# Patient Record
Sex: Male | Born: 1965 | Race: White | Hispanic: No | Marital: Single | Smoking: Current every day smoker
Health system: Southern US, Community
[De-identification: ages and names within clinical notes are randomized; demographics above are authoritative.]

## PROBLEM LIST (undated history)

## (undated) DIAGNOSIS — Z87442 Personal history of urinary calculi: Secondary | ICD-10-CM

## (undated) DIAGNOSIS — E7800 Pure hypercholesterolemia, unspecified: Secondary | ICD-10-CM

## (undated) DIAGNOSIS — R519 Headache, unspecified: Secondary | ICD-10-CM

## (undated) DIAGNOSIS — R7303 Prediabetes: Secondary | ICD-10-CM

## (undated) HISTORY — PX: OTHER SURGICAL HISTORY: SHX169

---

## 2009-02-06 ENCOUNTER — Emergency Department (HOSPITAL_COMMUNITY): Admission: EM | Admit: 2009-02-06 | Discharge: 2009-02-06 | Payer: Self-pay | Admitting: Emergency Medicine

## 2014-08-07 ENCOUNTER — Emergency Department (HOSPITAL_BASED_OUTPATIENT_CLINIC_OR_DEPARTMENT_OTHER)
Admission: EM | Admit: 2014-08-07 | Discharge: 2014-08-07 | Disposition: A | Discharge status: Home / Self Care | Payer: BLUE CROSS/BLUE SHIELD | Attending: Emergency Medicine | Admitting: Emergency Medicine

## 2014-08-07 ENCOUNTER — Emergency Department (HOSPITAL_BASED_OUTPATIENT_CLINIC_OR_DEPARTMENT_OTHER): Payer: BLUE CROSS/BLUE SHIELD

## 2014-08-07 ENCOUNTER — Encounter (HOSPITAL_BASED_OUTPATIENT_CLINIC_OR_DEPARTMENT_OTHER): Payer: Self-pay | Admitting: *Deleted

## 2014-08-07 DIAGNOSIS — Z720 Tobacco use: Secondary | ICD-10-CM | POA: Diagnosis not present

## 2014-08-07 DIAGNOSIS — S161XXA Strain of muscle, fascia and tendon at neck level, initial encounter: Secondary | ICD-10-CM | POA: Diagnosis not present

## 2014-08-07 DIAGNOSIS — Y998 Other external cause status: Secondary | ICD-10-CM | POA: Insufficient documentation

## 2014-08-07 DIAGNOSIS — Y92410 Unspecified street and highway as the place of occurrence of the external cause: Secondary | ICD-10-CM | POA: Diagnosis not present

## 2014-08-07 DIAGNOSIS — Y9389 Activity, other specified: Secondary | ICD-10-CM | POA: Insufficient documentation

## 2014-08-07 DIAGNOSIS — S199XXA Unspecified injury of neck, initial encounter: Secondary | ICD-10-CM | POA: Diagnosis present

## 2014-08-07 MED ORDER — CYCLOBENZAPRINE HCL 10 MG PO TABS: 10.0000 mg | ORAL_TABLET | Freq: Three times a day (TID) | ORAL | Status: AC | PRN

## 2014-08-07 MED ORDER — IBUPROFEN 800 MG PO TABS: 800.0000 mg | ORAL_TABLET | Freq: Three times a day (TID) | ORAL | Status: AC

## 2014-08-07 NOTE — ED Notes (Signed)
Unable to locate pt  

## 2014-08-07 NOTE — Discharge Instructions (Signed)
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
## 2015-08-23 DIAGNOSIS — M545 Low back pain: Secondary | ICD-10-CM | POA: Diagnosis not present

## 2015-11-25 ENCOUNTER — Encounter: Payer: Self-pay | Admitting: Sports Medicine

## 2015-11-25 ENCOUNTER — Ambulatory Visit (INDEPENDENT_AMBULATORY_CARE_PROVIDER_SITE_OTHER): Payer: BLUE CROSS/BLUE SHIELD

## 2015-11-25 ENCOUNTER — Ambulatory Visit (INDEPENDENT_AMBULATORY_CARE_PROVIDER_SITE_OTHER): Payer: BLUE CROSS/BLUE SHIELD | Admitting: Sports Medicine

## 2015-11-25 DIAGNOSIS — M79674 Pain in right toe(s): Secondary | ICD-10-CM | POA: Diagnosis not present

## 2015-11-25 DIAGNOSIS — S91209A Unspecified open wound of unspecified toe(s) with damage to nail, initial encounter: Secondary | ICD-10-CM

## 2015-11-25 DIAGNOSIS — L03031 Cellulitis of right toe: Secondary | ICD-10-CM

## 2015-11-25 DIAGNOSIS — L02611 Cutaneous abscess of right foot: Secondary | ICD-10-CM

## 2015-11-25 MED ORDER — HYDROCODONE-ACETAMINOPHEN 5-325 MG PO TABS: 1.0000 | ORAL_TABLET | Freq: Four times a day (QID) | ORAL | 0 refills | Status: DC | PRN

## 2015-11-25 MED ORDER — CEPHALEXIN 500 MG PO CAPS: 500.0000 mg | ORAL_CAPSULE | Freq: Three times a day (TID) | ORAL | 0 refills | Status: DC

## 2015-11-25 NOTE — Patient Instructions (Signed)
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
## 2015-12-10 ENCOUNTER — Ambulatory Visit (INDEPENDENT_AMBULATORY_CARE_PROVIDER_SITE_OTHER): Payer: BLUE CROSS/BLUE SHIELD | Admitting: Sports Medicine

## 2015-12-10 ENCOUNTER — Encounter: Payer: Self-pay | Admitting: Sports Medicine

## 2015-12-10 DIAGNOSIS — Z9889 Other specified postprocedural states: Secondary | ICD-10-CM

## 2015-12-10 DIAGNOSIS — M79674 Pain in right toe(s): Secondary | ICD-10-CM

## 2015-12-10 NOTE — Progress Notes (Signed)
Subjective: Kirk FowlerWilliam R Small is a 50 y.o. male patient returns to office today for follow up evaluation after having Right total temporary nail avulsion performed on 11-25-15. Patient has been soaking using betadine and applying topical antibiotic covered with bandaid daily. Patient denies fever/chills/nausea/vomitting/any other related constitutional symptoms at this time. Keflex completed. Reports still sore at times but no other issues.   There are no active problems to display for this patient.   Current Outpatient Prescriptions on File Prior to Visit  Medication Sig Dispense Refill  . cyclobenzaprine (FLEXERIL) 10 MG tablet Take 1 tablet (10 mg total) by mouth 3 (three) times daily as needed for muscle spasms. 20 tablet 0  . ibuprofen (ADVIL,MOTRIN) 800 MG tablet Take 1 tablet (800 mg total) by mouth 3 (three) times daily. 21 tablet 0   No current facility-administered medications on file prior to visit.     No Known Allergies  Objective:  General: Well developed, nourished, in no acute distress, alert and oriented x3   Dermatology: Skin is warm, dry and supple bilateral. Right hallux nail bed appears to be clean, dry, with mild granular tissue and surrounding eschar/scab. (-) Erythema. (-) Edema. (-) serosanguous drainage, mild maceration present. The remaining nails appear unremarkable at this time. There are no other lesions or other signs of infection present.  Neurovascular status: Intact. No lower extremity swelling; No pain with calf compression bilateral.  Musculoskeletal: Decreased tenderness to palpation of the Right hallux nail bed. Muscular strength within normal limits bilateral.   Assesement and Plan: Problem List Items Addressed This Visit    None    Visit Diagnoses    S/P nail surgery    -  Primary   Toe pain, right          -Examined patient  -Cleansed right hallux nail bed nail fold and gently scrubbed with peroxide and q-tip/curetted away eschar at site  and applied antibiotic cream covered with bandaid.  -Discussed plan of care with patient. -Patient to now begin soaking in a weak solution of Epsom salt and warm water. Patient was instructed to soak for 15-20 minutes each day until the toe appears normal and there is no drainage, redness, tenderness, or swelling at the procedure site, and apply neosporin and a gauze or bandaid dressing each day as needed. May leave open to air at night. -Educated patient on long term care after nail surgery secondary to injury. -Patient was instructed to monitor the toe for reoccurrence and signs of infection; Patient advised to return to office or go to ER if toe becomes red, hot or swollen. -Patient is to return as needed or sooner if problems arise.  Asencion Islamitorya Shanquita Ronning, DPM

## 2015-12-10 NOTE — Patient Instructions (Signed)
Long Term Care Instructions-Post Nail Surgery  You have had your nail removed.  This can drain for 6-8 weeks or longer.  It is important to keep this area clean, covered, and follow the soaking instructions dispensed at the time of your surgery.  This area will eventually dry and form a scab.  Once the scab forms you no longer need to soak or apply a dressing.  If at any time you experience an increase in pain, redness, swelling, or drainage, you should contact the office as soon as possible.
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
## 2016-11-25 DIAGNOSIS — J449 Chronic obstructive pulmonary disease, unspecified: Secondary | ICD-10-CM | POA: Diagnosis not present

## 2016-11-25 DIAGNOSIS — R0789 Other chest pain: Secondary | ICD-10-CM | POA: Diagnosis not present

## 2016-11-25 DIAGNOSIS — L570 Actinic keratosis: Secondary | ICD-10-CM | POA: Diagnosis not present

## 2016-11-27 DIAGNOSIS — Z8249 Family history of ischemic heart disease and other diseases of the circulatory system: Secondary | ICD-10-CM | POA: Diagnosis not present

## 2016-12-23 DIAGNOSIS — L820 Inflamed seborrheic keratosis: Secondary | ICD-10-CM | POA: Diagnosis not present

## 2016-12-23 DIAGNOSIS — C44612 Basal cell carcinoma of skin of right upper limb, including shoulder: Secondary | ICD-10-CM | POA: Diagnosis not present

## 2016-12-23 DIAGNOSIS — L570 Actinic keratosis: Secondary | ICD-10-CM | POA: Diagnosis not present

## 2016-12-23 DIAGNOSIS — X32XXXA Exposure to sunlight, initial encounter: Secondary | ICD-10-CM | POA: Diagnosis not present

## 2017-01-22 DIAGNOSIS — X32XXXD Exposure to sunlight, subsequent encounter: Secondary | ICD-10-CM | POA: Diagnosis not present

## 2017-01-22 DIAGNOSIS — L570 Actinic keratosis: Secondary | ICD-10-CM | POA: Diagnosis not present

## 2017-01-22 DIAGNOSIS — Z85828 Personal history of other malignant neoplasm of skin: Secondary | ICD-10-CM | POA: Diagnosis not present

## 2017-01-22 DIAGNOSIS — Z08 Encounter for follow-up examination after completed treatment for malignant neoplasm: Secondary | ICD-10-CM | POA: Diagnosis not present

## 2017-08-05 DIAGNOSIS — L570 Actinic keratosis: Secondary | ICD-10-CM | POA: Diagnosis not present

## 2017-08-05 DIAGNOSIS — Z08 Encounter for follow-up examination after completed treatment for malignant neoplasm: Secondary | ICD-10-CM | POA: Diagnosis not present

## 2017-08-05 DIAGNOSIS — X32XXXD Exposure to sunlight, subsequent encounter: Secondary | ICD-10-CM | POA: Diagnosis not present

## 2017-08-05 DIAGNOSIS — Z85828 Personal history of other malignant neoplasm of skin: Secondary | ICD-10-CM | POA: Diagnosis not present

## 2018-02-03 DIAGNOSIS — X32XXXD Exposure to sunlight, subsequent encounter: Secondary | ICD-10-CM | POA: Diagnosis not present

## 2018-02-03 DIAGNOSIS — Z85828 Personal history of other malignant neoplasm of skin: Secondary | ICD-10-CM | POA: Diagnosis not present

## 2018-02-03 DIAGNOSIS — L570 Actinic keratosis: Secondary | ICD-10-CM | POA: Diagnosis not present

## 2018-02-03 DIAGNOSIS — Z08 Encounter for follow-up examination after completed treatment for malignant neoplasm: Secondary | ICD-10-CM | POA: Diagnosis not present

## 2018-03-28 DIAGNOSIS — J209 Acute bronchitis, unspecified: Secondary | ICD-10-CM | POA: Diagnosis not present

## 2018-03-28 DIAGNOSIS — J0100 Acute maxillary sinusitis, unspecified: Secondary | ICD-10-CM | POA: Diagnosis not present

## 2018-05-13 DIAGNOSIS — Z043 Encounter for examination and observation following other accident: Secondary | ICD-10-CM | POA: Diagnosis not present

## 2018-05-13 DIAGNOSIS — S60413A Abrasion of left middle finger, initial encounter: Secondary | ICD-10-CM | POA: Diagnosis not present

## 2018-05-13 DIAGNOSIS — S0990XA Unspecified injury of head, initial encounter: Secondary | ICD-10-CM | POA: Diagnosis not present

## 2018-05-13 DIAGNOSIS — R0789 Other chest pain: Secondary | ICD-10-CM | POA: Diagnosis not present

## 2018-05-13 DIAGNOSIS — S20219A Contusion of unspecified front wall of thorax, initial encounter: Secondary | ICD-10-CM | POA: Diagnosis not present

## 2018-05-13 DIAGNOSIS — Y92413 State road as the place of occurrence of the external cause: Secondary | ICD-10-CM | POA: Diagnosis not present

## 2018-05-13 DIAGNOSIS — M542 Cervicalgia: Secondary | ICD-10-CM | POA: Diagnosis not present

## 2018-05-13 DIAGNOSIS — F17210 Nicotine dependence, cigarettes, uncomplicated: Secondary | ICD-10-CM | POA: Diagnosis not present

## 2018-05-13 DIAGNOSIS — Z23 Encounter for immunization: Secondary | ICD-10-CM | POA: Diagnosis not present

## 2018-05-13 DIAGNOSIS — S161XXA Strain of muscle, fascia and tendon at neck level, initial encounter: Secondary | ICD-10-CM | POA: Diagnosis not present

## 2018-05-13 DIAGNOSIS — S50812A Abrasion of left forearm, initial encounter: Secondary | ICD-10-CM | POA: Diagnosis not present

## 2018-05-13 DIAGNOSIS — S20212A Contusion of left front wall of thorax, initial encounter: Secondary | ICD-10-CM | POA: Diagnosis not present

## 2018-05-13 DIAGNOSIS — S0081XA Abrasion of other part of head, initial encounter: Secondary | ICD-10-CM | POA: Diagnosis not present

## 2018-06-09 DIAGNOSIS — J101 Influenza due to other identified influenza virus with other respiratory manifestations: Secondary | ICD-10-CM | POA: Diagnosis not present

## 2019-11-22 DIAGNOSIS — L255 Unspecified contact dermatitis due to plants, except food: Secondary | ICD-10-CM | POA: Diagnosis not present

## 2020-01-17 DIAGNOSIS — R05 Cough: Secondary | ICD-10-CM | POA: Diagnosis not present

## 2020-01-17 DIAGNOSIS — Z20822 Contact with and (suspected) exposure to covid-19: Secondary | ICD-10-CM | POA: Diagnosis not present

## 2020-01-19 ENCOUNTER — Telehealth: Payer: Self-pay | Admitting: Oncology

## 2020-01-19 NOTE — Telephone Encounter (Signed)
Called to Discuss with patient about Covid symptoms and the use of regeneron, a monoclonal antibody infusion for those with mild to moderate Covid symptoms and at a high risk of hospitalization.     Pt is qualified for this infusion at the [facility] infusion center due to co-morbid conditions and/or a member of an at-risk group.    No past medical history on file. High risk- Smoker and Obesity    Unable to reach pt. Left VM for return phone call.   Mignon Pine  AGNP-C [phone] (Infusion Center Hotline)

## 2022-10-26 ENCOUNTER — Encounter (HOSPITAL_BASED_OUTPATIENT_CLINIC_OR_DEPARTMENT_OTHER): Payer: Self-pay

## 2022-10-26 ENCOUNTER — Encounter (HOSPITAL_BASED_OUTPATIENT_CLINIC_OR_DEPARTMENT_OTHER): Payer: Self-pay | Admitting: Family Medicine

## 2022-10-26 ENCOUNTER — Ambulatory Visit (HOSPITAL_BASED_OUTPATIENT_CLINIC_OR_DEPARTMENT_OTHER)
Admission: RE | Admit: 2022-10-26 | Discharge: 2022-10-26 | Disposition: A | Discharge status: Home / Self Care | Payer: Commercial Managed Care - HMO | Source: Ambulatory Visit | Attending: Family Medicine | Admitting: Family Medicine

## 2022-10-26 ENCOUNTER — Other Ambulatory Visit (HOSPITAL_BASED_OUTPATIENT_CLINIC_OR_DEPARTMENT_OTHER): Payer: Self-pay | Admitting: Family Medicine

## 2022-10-26 ENCOUNTER — Ambulatory Visit (INDEPENDENT_AMBULATORY_CARE_PROVIDER_SITE_OTHER): Payer: Commercial Managed Care - HMO | Admitting: Family Medicine

## 2022-10-26 VITALS — BP 127/93 | HR 95 | Ht 68.0 in | Wt 185.0 lb

## 2022-10-26 DIAGNOSIS — R1032 Left lower quadrant pain: Secondary | ICD-10-CM | POA: Diagnosis present

## 2022-10-26 DIAGNOSIS — R310 Gross hematuria: Secondary | ICD-10-CM | POA: Insufficient documentation

## 2022-10-26 DIAGNOSIS — N200 Calculus of kidney: Secondary | ICD-10-CM

## 2022-10-26 DIAGNOSIS — J449 Chronic obstructive pulmonary disease, unspecified: Secondary | ICD-10-CM | POA: Insufficient documentation

## 2022-10-26 DIAGNOSIS — Z7689 Persons encountering health services in other specified circumstances: Secondary | ICD-10-CM

## 2022-10-26 DIAGNOSIS — Z8249 Family history of ischemic heart disease and other diseases of the circulatory system: Secondary | ICD-10-CM | POA: Insufficient documentation

## 2022-10-26 LAB — POCT URINALYSIS DIP (CLINITEK)
Bilirubin, UA: NEGATIVE
Glucose, UA: NEGATIVE mg/dL
Ketones, POC UA: NEGATIVE mg/dL
Nitrite, UA: NEGATIVE
POC PROTEIN,UA: 100 — AB
Spec Grav, UA: >=1.03 — AB (ref 1.010–1.025)
Urobilinogen, UA: 0.2 E.U./dL
pH, UA: 6 (ref 5.0–8.0)

## 2022-10-26 MED ORDER — TAMSULOSIN HCL 0.4 MG PO CAPS: 0.4000 mg | ORAL_CAPSULE | Freq: Every day | ORAL | 3 refills | Status: AC

## 2022-10-26 NOTE — Progress Notes (Signed)
Patient made aware of CT results including large 13mm stone in left renal pelvis concerning for intermittent obstruction and additional nonobstructing stones. Tamsulosin sent in for patient to start. CBC and CMP changed to STAT from labwork drawn today to evaluate for infection and renal function. Pt denies retention, fever/chills at this time. Urgent referral placed to [facility]. Pt understanding to go to ER if fever/chills, urine retention, pain, or nausea/vomiting occurs.

## 2022-10-26 NOTE — Patient Instructions (Signed)
Go downstairs to 1st Floor Imaging for your CT Scan.   I will contact you with the results by the end of day.

## 2022-10-26 NOTE — Progress Notes (Signed)
New Patient Office Visit  Subjective:   Kirk Small [birth year] 10/26/2022  Chief Complaint  Patient presents with   New Patient (Initial Visit)   Abdominal Pain    Pt is here to establish with new PCP. Also has been having abdominal pain on left side which has been bothering him for about a month now.    HPI: Kirk Small presents today to establish care at [facility] at [facility]. Introduced to Publishing rights manager role and practice setting.  All questions answered.   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 Pain has been worsening in the past month.  Location:  LLQ Pain, left groin Description of pain: Sharp, intermittent. Pain increases with movement.  Radiation: Towards Umbilicus and towards pelvis, inguingal area Severity: 8/10 Alleviating factors: Goody Powder, Ibuprofen Aggravating factors: Constipation  Treatments tried: OTC Medications  Fever: no Nausea: yes Vomiting: yes Weight loss: no Decreased appetite: no Diarrhea: no Constipation: yes Blood in stool: no Heartburn: no Dysuria/urinary frequency: no Hematuria: no History of sexually transmitted disease: no Recurrent NSAID use: yes  He states sometimes the pain eases with eating. Pain worsens with movement in LLQ. Patient does not drink a lot of water, usually sweet tea and Kool Aid daily. Pt states his urine "has been darker" than usual.   The following portions of the patient's history were reviewed and updated as appropriate: past medical history, past surgical history, family history, social history, allergies, medications, and problem list.   Patient Active Problem List   Diagnosis Date Noted   Chronic obstructive pulmonary disease (HCC) 10/26/2022   Family history of MI (myocardial infarction) 10/26/2022   Encounter to establish care  10/26/2022   Left lower quadrant abdominal pain 10/26/2022   Gross hematuria 10/26/2022   History reviewed. No pertinent past medical history. History reviewed. No pertinent surgical history. Family History  Problem Relation Age of Onset   Cancer Paternal Uncle    Social History   Socioeconomic History   Marital status: Single    Spouse name: Not on file   Number of children: Not on file   Years of education: Not on file   Highest education level: Not on file  Occupational History   Not on file  Tobacco Use   Smoking status: Former    Packs/day: 1.00    Years: 38.00    Additional pack years: 0.00    Total pack years: 38.00    Types: Cigarettes    Quit date: 79    Years since quitting: 39.5   Smokeless tobacco: Not on file  Substance and Sexual Activity   Alcohol use: No   Drug use: No   Sexual activity: Not on file  Other Topics Concern   Not on file  Social History Narrative   Not on file   Social Determinants of Health   Financial Resource Strain: Not on file  Food Insecurity: Not on file  Transportation Needs: Not on file  Physical Activity: Not on file  Stress: Not on file  Social Connections: Not on file  Intimate Partner Violence: Not on file   Outpatient Medications Prior to Visit  Medication Sig Dispense Refill   cyclobenzaprine (FLEXERIL) 10 MG tablet Take 1 tablet (10 mg total) by mouth 3 (three) times daily as needed for muscle spasms. 20 tablet 0   ibuprofen (  ADVIL,MOTRIN) 800 MG tablet Take 1 tablet (800 mg total) by mouth 3 (three) times daily. 21 tablet 0   No facility-administered medications prior to visit.   No Known Allergies  ROS: A complete ROS was performed with pertinent positives/negatives noted in the HPI. The remainder of the ROS are negative.   Objective:   Today's Vitals   10/26/22 1311  BP: (!) 127/93  Pulse: 95  SpO2: 98%  Weight: 185 lb (83.9 kg)  Height: 5\' 8"  (1.727 m)    GENERAL: Well-appearing, in NAD. Well  nourished.  SKIN: Pink, warm and dry. No rash, lesion, ulceration, or ecchymoses.  NECK: Trachea midline. Full ROM w/o pain or tenderness. No lymphadenopathy.  RESPIRATORY: Chest wall symmetrical. Respirations even and non-labored. Breath sounds clear to auscultation bilaterally.  CARDIAC: S1, S2 present, regular rate and rhythm. Peripheral pulses 2+ bilaterally.  GI: Abdomen soft, non-distended.. Normoactive bowel sounds. Moderate tenderness to LLQ and left inguinal area. No palpable masses.  No hepatomegaly or splenomegaly. No CVA tenderness.  MSK: Muscle tone and strength appropriate for age. Joints w/o tenderness, redness, or swelling.  EXTREMITIES: Without clubbing, cyanosis, or edema.  NEUROLOGIC: No motor or sensory deficits. Steady, even gait.  PSYCH/MENTAL STATUS: Alert, oriented x 3. Cooperative, appropriate mood and affect.   Health Maintenance Due  Topic Date Due   HIV Screening  Never done   Hepatitis C Screening  Never done   Colonoscopy  Never done   Zoster Vaccines- Shingrix (1 of 2) Never done    Results for orders placed or performed in visit on 10/26/22  POCT URINALYSIS DIP (CLINITEK)  Result Value Ref Range   Color, UA brown (A) yellow   Clarity, UA cloudy (A) clear   Glucose, UA negative negative mg/dL   Bilirubin, UA negative negative   Ketones, POC UA negative negative mg/dL   Spec Grav, UA >=9.629 (A) 1.010 - 1.025   Blood, UA large (A) negative   pH, UA 6.0 5.0 - 8.0   POC PROTEIN,UA =100 (A) negative, trace   Urobilinogen, UA 0.2 0.2 or 1.0 E.U./dL   Nitrite, UA Negative Negative   Leukocytes, UA Trace (A) Negative       Assessment & Plan:  1. Encounter to establish care Discussed need for AE and labs. Pt will return in 4 weeks for physical exam. Labs drawn.   - Lipid panel - TSH - Hemoglobin A1c  2. Left lower quadrant abdominal pain 3. Gross hematuria Patient had gross hematuria in clinic with urine sample. Will obtain STAT CT Stone study to  rule out renal stone versus bladder cancer versus renal or bladder mass causing symptoms. Will obtain labs today for PSA, CMP, CBC, and urine culture as well. Pt sent for CT to Imaging at [facility] from the clinic.   - PSA - Comprehensive metabolic panel - CBC with Differential/Platelet - Urine Culture - CT RENAL STONE STUDY; Future - POCT URINALYSIS DIP (CLINITEK)   Patient to reach out to office if new, worrisome, or unresolved symptoms arise or if no improvement in patient's condition. Patient verbalized understanding and is agreeable to treatment plan. All questions answered to patient's satisfaction.    Return in about 4 weeks (around 11/23/2022) for ANNUAL PHYSICAL.   Yolanda Manges, FNP

## 2022-10-27 ENCOUNTER — Encounter (HOSPITAL_BASED_OUTPATIENT_CLINIC_OR_DEPARTMENT_OTHER): Payer: Self-pay | Admitting: Family Medicine

## 2022-10-27 ENCOUNTER — Telehealth (HOSPITAL_BASED_OUTPATIENT_CLINIC_OR_DEPARTMENT_OTHER): Payer: Self-pay | Admitting: Family Medicine

## 2022-10-27 ENCOUNTER — Other Ambulatory Visit (HOSPITAL_BASED_OUTPATIENT_CLINIC_OR_DEPARTMENT_OTHER): Payer: Self-pay | Admitting: Family Medicine

## 2022-10-27 DIAGNOSIS — N200 Calculus of kidney: Secondary | ICD-10-CM | POA: Insufficient documentation

## 2022-10-27 LAB — COMPREHENSIVE METABOLIC PANEL
ALT: 19 IU/L (ref 0–44)
AST: 20 IU/L (ref 0–40)
Albumin: 4.2 g/dL (ref 3.8–4.9)
Alkaline Phosphatase: 75 IU/L (ref 44–121)
BUN/Creatinine Ratio: 18 (ref 9–20)
BUN: 20 mg/dL (ref 6–24)
Bilirubin Total: <0.2 mg/dL (ref 0.0–1.2)
CO2: 24 mmol/L (ref 20–29)
Calcium: 9.2 mg/dL (ref 8.7–10.2)
Chloride: 103 mmol/L (ref 96–106)
Creatinine, Ser: 1.09 mg/dL (ref 0.76–1.27)
Globulin, Total: 2.4 g/dL (ref 1.5–4.5)
Glucose: 92 mg/dL (ref 70–99)
Potassium: 4.8 mmol/L (ref 3.5–5.2)
Sodium: 138 mmol/L (ref 134–144)
Total Protein: 6.6 g/dL (ref 6.0–8.5)
eGFR: 81 mL/min/{1.73_m2} (ref >59)

## 2022-10-27 LAB — CBC WITH DIFFERENTIAL/PLATELET
Basophils Absolute: 0.1 10*3/uL (ref 0.0–0.2)
Basos: 1 %
EOS (ABSOLUTE): 0.3 10*3/uL (ref 0.0–0.4)
Eos: 4 %
Hematocrit: 46.1 % (ref 37.5–51.0)
Hemoglobin: 15 g/dL (ref 13.0–17.7)
Immature Grans (Abs): 0 10*3/uL (ref 0.0–0.1)
Immature Granulocytes: 0 %
Lymphocytes Absolute: 3.1 10*3/uL (ref 0.7–3.1)
Lymphs: 33 %
MCH: 28.4 pg (ref 26.6–33.0)
MCHC: 32.5 g/dL (ref 31.5–35.7)
MCV: 87 fL (ref 79–97)
Monocytes Absolute: 0.6 10*3/uL (ref 0.1–0.9)
Monocytes: 6 %
Neutrophils Absolute: 5.2 10*3/uL (ref 1.4–7.0)
Neutrophils: 56 %
Platelets: 277 10*3/uL (ref 150–450)
RBC: 5.29 x10E6/uL (ref 4.14–5.80)
RDW: 13.3 % (ref 11.6–15.4)
WBC: 9.3 10*3/uL (ref 3.4–10.8)

## 2022-10-27 LAB — TSH: TSH: 1.36 u[IU]/mL (ref 0.450–4.500)

## 2022-10-27 LAB — PSA: Prostate Specific Ag, Serum: 1.2 ng/mL (ref 0.0–4.0)

## 2022-10-27 LAB — LIPID PANEL
Chol/HDL Ratio: 5.6 ratio — ABNORMAL HIGH (ref 0.0–5.0)
Cholesterol, Total: 201 mg/dL — ABNORMAL HIGH (ref 100–199)
HDL: 36 mg/dL — ABNORMAL LOW (ref >39)
LDL Chol Calc (NIH): 111 mg/dL — ABNORMAL HIGH (ref 0–99)
Triglycerides: 310 mg/dL — ABNORMAL HIGH (ref 0–149)
VLDL Cholesterol Cal: 54 mg/dL — ABNORMAL HIGH (ref 5–40)

## 2022-10-27 LAB — HEMOGLOBIN A1C
Est. average glucose Bld gHb Est-mCnc: 131 mg/dL
Hgb A1c MFr Bld: 6.2 % — ABNORMAL HIGH (ref 4.8–5.6)

## 2022-10-27 MED ORDER — TRAMADOL HCL 50 MG PO TABS: 50.0000 mg | ORAL_TABLET | Freq: Three times a day (TID) | ORAL | 0 refills | Status: AC | PRN

## 2022-10-27 MED ORDER — ATORVASTATIN CALCIUM 10 MG PO TABS: 10.0000 mg | ORAL_TABLET | Freq: Every evening | ORAL | 3 refills | Status: AC

## 2022-10-27 NOTE — Telephone Encounter (Signed)
Called and spoke with pt. Pt stated he did receive the tamsulosin that was sent to pharmacy 7/1 but thought he was also going to be having the ibuprofen and flexeril refilled as well. I stated to pt that those medications were meds to take as needed and looked like they were old prescriptions.  Pt stated that he believed he received those one time before when he was having back problems but states that he was wanting to know if the ibuprofen can be refilled as he is having a lot of abdominal pain.  Along with speaking to pt, I went over the lab results with him and stated that it was recommended for him to begin therapy for HLD and prediabetes and asked him if he would be okay starting therapy now and he stated it was okay.  Alexis, please advise on all this for pt.

## 2022-10-27 NOTE — Telephone Encounter (Signed)
patient left voice mail saying 2 prescriptions were not called into his pharmacy 10/26/22. Please advise patient

## 2022-10-27 NOTE — Telephone Encounter (Signed)
Called and spoke with pt letting him know the info per Jon Gills and that meds were sent to the pharmacy for him. Pt verbalized understanding. Nothing further needed.

## 2022-10-27 NOTE — Progress Notes (Signed)
No signs of infection or renal impairment. Continue with urology follow up.  Cholesterol is significantly elevated and The 10-year ASCVD risk score (Arnett DK, et al., 2019) is: 13% which would indicate starting statin therapy with diet and exercise. He is also in the prediabetic range. Recommend starting therapy for HLD and prediabetes. He needs to make appt for follow up. Note sent to patient to call office to make appt.

## 2022-10-28 ENCOUNTER — Other Ambulatory Visit: Payer: Self-pay | Admitting: Urology

## 2022-10-28 LAB — URINE CULTURE

## 2022-10-28 NOTE — Progress Notes (Signed)
Urine culture negative for infection

## 2022-10-30 ENCOUNTER — Other Ambulatory Visit: Payer: Self-pay | Admitting: Urology

## 2022-11-05 ENCOUNTER — Telehealth: Payer: Self-pay | Admitting: *Deleted

## 2022-11-05 NOTE — Patient Instructions (Addendum)
SURGICAL WAITING ROOM VISITATION  Patients having surgery or a procedure may have no more than 2 support people in the waiting area - these visitors may rotate.    Children under the age of 76 must have an adult with them who is not the patient.  Due to an increase in RSV and influenza rates and associated hospitalizations, children ages 74 and under may not visit patients in [facility] hospitals.  If the patient needs to stay at the hospital during part of their recovery, the visitor guidelines for inpatient rooms apply. Pre-op nurse will coordinate an appropriate time for 1 support person to accompany patient in pre-op.  This support person may not rotate.    Please refer to the [facility] website for the visitor guidelines for Inpatients (after your surgery is over and you are in a regular room).    Your procedure is scheduled on: 11/10/22   Report to [facility] Main Entrance    Report to admitting at 9:55 AM   Call this number if you have problems the morning of surgery [phone]   Do not eat food or drink liquids :After Midnight.          If you have questions, please contact your surgeon's office.   FOLLOW BOWEL PREP AND ANY ADDITIONAL PRE OP INSTRUCTIONS YOU RECEIVED FROM YOUR SURGEON'S OFFICE!!!     Oral Hygiene is also important to reduce your risk of infection.                                    Remember - BRUSH YOUR TEETH THE MORNING OF SURGERY WITH YOUR REGULAR TOOTHPASTE  DENTURES WILL BE REMOVED PRIOR TO SURGERY PLEASE DO NOT APPLY "Poly grip" OR ADHESIVES!!!   Do NOT smoke after Midnight   Take these medicines the morning of surgery with A SIP OF WATER: Tamsulosin                               You may not have any metal on your body including jewelry, and body piercing             Do not wear lotions, powders, cologne, or deodorant              Men may shave face and neck.   Do not bring valuables to the hospital. [facility] IS NOT              RESPONSIBLE   FOR VALUABLES.   Contacts, glasses, dentures or bridgework may not be worn into surgery.  DO NOT BRING YOUR HOME MEDICATIONS TO THE HOSPITAL. PHARMACY WILL DISPENSE MEDICATIONS LISTED ON YOUR MEDICATION LIST TO YOU DURING YOUR ADMISSION IN THE HOSPITAL!    Patients discharged on the day of surgery will not be allowed to drive home.  Someone NEEDS to stay with you for the first 24 hours after anesthesia.              Please read over the following fact sheets you were given: IF YOU HAVE QUESTIONS ABOUT YOUR PRE-OP INSTRUCTIONS PLEASE CALL [phone] [place]    If you received a COVID test during your pre-op visit  it is requested that you wear a mask when out in public, stay away from anyone that may not be feeling well and notify your surgeon if you develop symptoms. If  you test positive for Covid or have been in contact with anyone that has tested positive in the last 10 days please notify you surgeon.    [facility] - Preparing for Surgery Before surgery, you can play an important role.  Because skin is not sterile, your skin needs to be as free of germs as possible.  You can reduce the number of germs on your skin by washing with CHG (chlorahexidine gluconate) soap before surgery.  CHG is an antiseptic cleaner which kills germs and bonds with the skin to continue killing germs even after washing. Please DO NOT use if you have an allergy to CHG or antibacterial soaps.  If your skin becomes reddened/irritated stop using the CHG and inform your nurse when you arrive at Short Stay. Do not shave (including legs and underarms) for at least 48 hours prior to the first CHG shower.  You may shave your face/neck.  Please follow these instructions carefully:  1.  Shower with CHG Soap the night before surgery and the  morning of surgery.  2.  If you choose to wash your hair, wash your hair first as usual with your normal  shampoo.  3.  After you shampoo, rinse your hair and body  thoroughly to remove the shampoo.                             4.  Use CHG as you would any other liquid soap.  You can apply chg directly to the skin and wash.  Gently with a scrungie or clean washcloth.  5.  Apply the CHG Soap to your body ONLY FROM THE NECK DOWN.   Do   not use on face/ open                           Wound or open sores. Avoid contact with eyes, ears mouth and   genitals (private parts).                       Wash face,  Genitals (private parts) with your normal soap.             6.  Wash thoroughly, paying special attention to the area where your    surgery  will be performed.  7.  Thoroughly rinse your body with warm water from the neck down.  8.  DO NOT shower/wash with your normal soap after using and rinsing off the CHG Soap.                9.  Pat yourself dry with a clean towel.            10.  Wear clean pajamas.            11.  Place clean sheets on your bed the night of your first shower and do not  sleep with pets. Day of Surgery : Do not apply any lotions/deodorants the morning of surgery.  Please wear clean clothes to the hospital/surgery center.  FAILURE TO FOLLOW THESE INSTRUCTIONS MAY RESULT IN THE CANCELLATION OF YOUR SURGERY  PATIENT SIGNATURE_________________________________  NURSE SIGNATURE__________________________________  ________________________________________________________________________

## 2022-11-05 NOTE — Progress Notes (Addendum)
COVID Vaccine Completed: no  Date of COVID positive in last 90 days: no  PCP - Jerre Simon, FNP Cardiologist - n/a  Chest x-ray - n/a EKG - 11/06/22 Epic/chart Stress Test - n/a ECHO - n/a Cardiac Cath - n/a Pacemaker/ICD device last checked: n/a Spinal Cord Stimulator: n/a  Bowel Prep - no  Sleep Study - n/a CPAP -   Fasting Blood Sugar - pre DM, no meds or checks at home Checks Blood Sugar   Last dose of GLP1 agonist-  N/A GLP1 instructions:  N/A   Last dose of SGLT-2 inhibitors-  N/A SGLT-2 instructions: N/A   Blood Thinner Instructions:  n/a Aspirin Instructions: Last Dose:  Activity level: Can go up a flight of stairs and perform activities of daily living without stopping and without symptoms of chest pain or shortness of breath.   Anesthesia review: no hx of HTN, BP at PAT 138/106, 133/106, and 1267/96. He is reporting a lot of pain from his kidney stone. No other symptoms. Instructed to monitor over the weekend and call his PCP first thing Monday morning if continued to be elevated. Tachy on EKG at 106  Patient denies shortness of breath, fever, cough and chest pain at PAT appointment  Patient verbalized understanding of instructions that were given to them at the PAT appointment. Patient was also instructed that they will need to review over the PAT instructions again at home before surgery.

## 2022-11-05 NOTE — Telephone Encounter (Signed)
Called to schedule colon cancer screening. Pt is dealing with kidney stones right now and is supposed to have this taken care of Tuesday. Will think of which screening he would like to do colonoscopy or cologuard and let FNP alexis caudle know at  follow up appt 7/29

## 2022-11-06 ENCOUNTER — Other Ambulatory Visit: Payer: Self-pay

## 2022-11-06 ENCOUNTER — Ambulatory Visit (HOSPITAL_BASED_OUTPATIENT_CLINIC_OR_DEPARTMENT_OTHER): Admission: RE | Admit: 2022-11-06 | Payer: Commercial Managed Care - HMO | Source: Home / Self Care | Admitting: Urology

## 2022-11-06 ENCOUNTER — Encounter (HOSPITAL_COMMUNITY)
Admission: RE | Admit: 2022-11-06 | Discharge: 2022-11-06 | Disposition: A | Discharge status: Home / Self Care | Payer: Commercial Managed Care - HMO | Source: Ambulatory Visit | Attending: Urology | Admitting: Urology

## 2022-11-06 ENCOUNTER — Encounter (HOSPITAL_COMMUNITY): Payer: Self-pay

## 2022-11-06 ENCOUNTER — Encounter (HOSPITAL_BASED_OUTPATIENT_CLINIC_OR_DEPARTMENT_OTHER): Admission: RE | Payer: Self-pay | Source: Home / Self Care

## 2022-11-06 VITALS — BP 127/96 | HR 107 | Temp 98.4°F | Resp 18 | Ht 68.0 in | Wt 182.0 lb

## 2022-11-06 DIAGNOSIS — Z01818 Encounter for other preprocedural examination: Secondary | ICD-10-CM | POA: Diagnosis present

## 2022-11-06 DIAGNOSIS — R000 Tachycardia, unspecified: Secondary | ICD-10-CM | POA: Insufficient documentation

## 2022-11-06 DIAGNOSIS — I10 Essential (primary) hypertension: Secondary | ICD-10-CM | POA: Insufficient documentation

## 2022-11-06 HISTORY — DX: Headache, unspecified: R51.9

## 2022-11-06 HISTORY — DX: Personal history of urinary calculi: Z87.442

## 2022-11-06 HISTORY — DX: Prediabetes: R73.03

## 2022-11-06 HISTORY — DX: Pure hypercholesterolemia, unspecified: E78.00

## 2022-11-06 LAB — CBC
HCT: 48.2 % (ref 39.0–52.0)
Hemoglobin: 15.4 g/dL (ref 13.0–17.0)
MCH: 28.6 pg (ref 26.0–34.0)
MCHC: 32 g/dL (ref 30.0–36.0)
MCV: 89.6 fL (ref 80.0–100.0)
Platelets: 315 10*3/uL (ref 150–400)
RBC: 5.38 MIL/uL (ref 4.22–5.81)
RDW: 13.7 % (ref 11.5–15.5)
WBC: 9 10*3/uL (ref 4.0–10.5)
nRBC: 0 % (ref 0.0–0.2)

## 2022-11-06 LAB — BASIC METABOLIC PANEL
Anion gap: 10 (ref 5–15)
BUN: 25 mg/dL — ABNORMAL HIGH (ref 6–20)
CO2: 25 mmol/L (ref 22–32)
Calcium: 9.5 mg/dL (ref 8.9–10.3)
Chloride: 101 mmol/L (ref 98–111)
Creatinine, Ser: 1.18 mg/dL (ref 0.61–1.24)
GFR, Estimated: >60 mL/min (ref >60)
Glucose, Bld: 111 mg/dL — ABNORMAL HIGH (ref 70–99)
Potassium: 4 mmol/L (ref 3.5–5.1)
Sodium: 136 mmol/L (ref 135–145)

## 2022-11-06 SURGERY — LITHOTRIPSY, ESWL
Anesthesia: LOCAL | Laterality: Left

## 2022-11-09 NOTE — Anesthesia Preprocedure Evaluation (Signed)
Anesthesia Evaluation  Patient identified by MRN, date of birth, ID band Patient awake    Reviewed: Allergy & Precautions, NPO status , Patient's Chart, lab work & pertinent test results  History of Anesthesia Complications Negative for: history of anesthetic complications  Airway Mallampati: II  TM Distance: >3 FB Neck ROM: Full    Dental no notable dental hx. (+) Dental Advisory Given   Pulmonary COPD, Current Smoker and Patient abstained from smoking.   Pulmonary exam normal        Cardiovascular negative cardio ROS Normal cardiovascular exam     Neuro/Psych negative neurological ROS     GI/Hepatic negative GI ROS, Neg liver ROS,,,  Endo/Other  negative endocrine ROS    Renal/GU      Musculoskeletal negative musculoskeletal ROS (+)    Abdominal   Peds  Hematology negative hematology ROS (+)   Anesthesia Other Findings   Reproductive/Obstetrics                             Anesthesia Physical Anesthesia Plan  ASA: 2  Anesthesia Plan: General   Post-op Pain Management: Tylenol PO (pre-op)* and Toradol IV (intra-op)*   Induction: Intravenous  PONV Risk Score and Plan: 3 and Ondansetron, Dexamethasone and Midazolam  Airway Management Planned: LMA  Additional Equipment:   Intra-op Plan:   Post-operative Plan:   Informed Consent: I have reviewed the patients History and Physical, chart, labs and discussed the procedure including the risks, benefits and alternatives for the proposed anesthesia with the patient or authorized representative who has indicated his/her understanding and acceptance.     Dental advisory given  Plan Discussed with: Anesthesiologist and CRNA  Anesthesia Plan Comments:        Anesthesia Quick Evaluation

## 2022-11-09 NOTE — H&P (Signed)
I was consulted to assess the patient for left renal pain. He was found to have a 13 mm stone in the left renal pelvis with no hydronephrosis but mild fat stranding. He had a small nonobstructing stone in left kidney and right kidney. On July 1 his white blood count was 9.3 and his urine culture was negative. His PSA was 1.2.   Patient has had pain off and on in the left side or left lower quadrant for 2 months. Worse in the last 2 weeks. Almost daily. He does get a little bit of nausea. He is thrown up twice. No fever.   He did see orange color urine and was told he had a lot of blood in the urine. He takes a lot of ibuprofen and Goody powder. Has a smoking history   At baseline he voids every 2-3 hours and gets up 3-4 times at night. He drinks a lot of fluid at night   No history of kidney stones bladder surgery or bladder infections.     ALLERGIES: None   MEDICATIONS: Atorvastatin Calcium 10 mg tablet 1 tablet PO Daily  Tamsulosin Hcl 0.4 mg capsule 1 capsule PO Daily  Tramadol Hcl 50 mg tablet 1 tablet PO Daily     GU PSH: None   NON-GU PSH: None   GU PMH: Urinary Calculus, Unspec    NON-GU PMH: Hypercholesterolemia    FAMILY HISTORY: None   SOCIAL HISTORY: Marital Status: Single Ethnicity: Not Hispanic Or Latino; Race: White Current Smoking Status: Patient smokes.   Tobacco Use Assessment Completed: Used Tobacco in last 30 days? Has never drank.  Drinks 4+ caffeinated drinks per day.    REVIEW OF SYSTEMS:    GU Review Male:   Patient reports get up at night to urinate and stream starts and stops. Patient denies frequent urination, hard to postpone urination, burning/ pain with urination, leakage of urine, trouble starting your stream, have to strain to urinate , erection problems, and penile pain.  Gastrointestinal (Upper):   Patient reports nausea and vomiting. Patient denies indigestion/ heartburn.  Gastrointestinal (Lower):   Patient denies diarrhea and constipation.   Constitutional:   Patient denies fever, night sweats, weight loss, and fatigue.  Skin:   Patient denies skin rash/ lesion and itching.  Eyes:   Patient denies blurred vision and double vision.  Ears/ Nose/ Throat:   Patient denies sore throat and sinus problems.  Hematologic/Lymphatic:   Patient denies easy bruising and swollen glands.  Cardiovascular:   Patient denies leg swelling and chest pains.  Respiratory:   Patient denies cough and shortness of breath.  Endocrine:   Patient denies excessive thirst.  Musculoskeletal:   Patient denies back pain and joint pain.  Neurological:   Patient denies headaches and dizziness.  Psychologic:   Patient denies depression and anxiety.   Notes: hematuria    VITAL SIGNS:      10/28/2022 10:09 AM  Weight 185 lb / 83.91 kg  Height 68 in / 172.72 cm  BP 150/88 mmHg  Pulse 106 /min  Temperature 97.8 F / 36.5 C  BMI 28.1 kg/m   MULTI-SYSTEM PHYSICAL EXAMINATION:    Constitutional: Well-nourished. No physical deformities. Normally developed. Good grooming.  Neck: Neck symmetrical, not swollen. Normal tracheal position.  Respiratory: No labored breathing, no use of accessory muscles.   Cardiovascular: Normal temperature, normal extremity pulses, no swelling, no varicosities.  Lymphatic: No enlargement of neck, axillae, groin.  Skin: No paleness, no jaundice, no cyanosis. No  lesion, no ulcer, no rash.  Neurologic / Psychiatric: Oriented to time, oriented to place, oriented to person. No depression, no anxiety, no agitation.  Gastrointestinal: No mass, no tenderness, no rigidity, non obese abdomen.  Eyes: Normal conjunctivae. Normal eyelids.  Ears, Nose, Mouth, and Throat: Left ear no scars, no lesions, no masses. Right ear no scars, no lesions, no masses. Nose no scars, no lesions, no masses. Normal hearing. Normal lips.  Musculoskeletal: Normal gait and station of head and neck.     PAST DATA REVIEW: None   PROCEDURES:         KUB - [id number]       . Patient confirmed No Neulasta OnPro Device.          PVR Ultrasound - 60454  Scanned Volume: 000 cc         Urinalysis w/Scope Dipstick Dipstick Cont'd Micro  Color: Yellow Bilirubin: Neg mg/dL WBC/hpf: 6 - 09/WJX  Appearance: Cloudy Ketones: Neg mg/dL RBC/hpf: 20 - 91/YNW  Specific Gravity: 1.025 Blood: 3+ ery/uL Bacteria: Rare (0-9/hpf)  pH: 5.5 Protein: Trace mg/dL Cystals: Amorph Urates  Glucose: Neg mg/dL Urobilinogen: 0.2 mg/dL Casts: Hyaline    Nitrites: Neg Trichomonas: Not Present    Leukocyte Esterase: 1+ leu/uL Mucous: Present      Epithelial Cells: NS (Not Seen)      Yeast: NS (Not Seen)      Sperm: Not Present    ASSESSMENT:      ICD-10 Details  1 GU:   Urinary Frequency - R35.0   2   Nocturia - R35.1   3   Renal and ureteral calculus - N20.2           Notes:   I was consulted to assess the patient for left renal pain. He was found to have a 13 mm stone in the left renal pelvis with no hydronephrosis but mild fat stranding. He had a small nonobstructing stone in left kidney and right kidney. On July 1 his white blood count was 9.3 and his urine culture was negative. His PSA was 1.2.   Patient has had pain off and on in the left side or left lower quadrant for 2 months. Worse in the last 2 weeks. Almost daily. He does get a little bit of nausea. He is thrown up twice. No fever.   He did see orange color urine and was told he had a lot of blood in the urine. He takes a lot of ibuprofen and Goody powder. Has a smoking history   At baseline he voids every 2-3 hours and gets up 3-4 times at night. He drinks a lot of fluid at night   Nontoxic. No CVA tenderness. Scrotum normal. Small prostate.   Urine reviewed. Urine sent for culture. Chart reviewed. Bladder scan 0 mL crystals and blood in urine and rare bacteria   Patient has stone on left side. Picture drawn. He has had some blood in the urine. I think there is enough evidence that he likely is having some  intermittent colic. Role of lithotripsy versus ureteroscopy described. He understands that he may or may not need workup for hematuria in the future, for example cystoscopy with a history of hematuria and a smoking history. He said he was having the orange color urine when he was having the pain. He takes Advil and Goody powder which needs to be stopped. We talked about this. We will schedule him for lithotripsy next week. Long-term follow-up for hematuria recommended and  emphasized. I called in hydrocodone. He is on Flomax. Ultram did not help him much today and he took 1 tablet   Pros cons risk of lithotripsy discussed. Rare injury to kidney and surrounding organs and sequelae discussed. Role of needing second procedure i.e. lithotripsy or ureteroscopy discussed. Usual template discussed.    PLAN:            Medications New Meds: Oxycodone-Acetaminophen 5 mg-325 mg tablet 1 tablet PO Q 6 H PRN   #15  0 Refill(s)  Pharmacy Name:  [id number]  Address:  [address]   [place], [place] [address]  Phone:  [phone]  Fax:  [phone]            Orders Labs Urine Culture  X-Rays: KUB

## 2022-11-10 ENCOUNTER — Ambulatory Visit (HOSPITAL_COMMUNITY)
Admission: RE | Admit: 2022-11-10 | Discharge: 2022-11-10 | Disposition: A | Discharge status: Home / Self Care | Payer: Commercial Managed Care - HMO | Attending: Urology | Admitting: Urology

## 2022-11-10 ENCOUNTER — Encounter (HOSPITAL_COMMUNITY): Payer: Self-pay | Admitting: Urology

## 2022-11-10 ENCOUNTER — Ambulatory Visit (HOSPITAL_BASED_OUTPATIENT_CLINIC_OR_DEPARTMENT_OTHER): Payer: Commercial Managed Care - HMO | Admitting: Anesthesiology

## 2022-11-10 ENCOUNTER — Ambulatory Visit (HOSPITAL_COMMUNITY): Payer: Commercial Managed Care - HMO

## 2022-11-10 ENCOUNTER — Ambulatory Visit (HOSPITAL_COMMUNITY): Payer: Commercial Managed Care - HMO | Admitting: Physician Assistant

## 2022-11-10 ENCOUNTER — Encounter (HOSPITAL_COMMUNITY)
Admission: RE | Disposition: A | Discharge status: Home / Self Care | Payer: Self-pay | Source: Home / Self Care | Attending: Urology

## 2022-11-10 DIAGNOSIS — R351 Nocturia: Secondary | ICD-10-CM | POA: Diagnosis not present

## 2022-11-10 DIAGNOSIS — N200 Calculus of kidney: Secondary | ICD-10-CM | POA: Insufficient documentation

## 2022-11-10 DIAGNOSIS — R350 Frequency of micturition: Secondary | ICD-10-CM | POA: Diagnosis not present

## 2022-11-10 DIAGNOSIS — F17200 Nicotine dependence, unspecified, uncomplicated: Secondary | ICD-10-CM | POA: Insufficient documentation

## 2022-11-10 DIAGNOSIS — J449 Chronic obstructive pulmonary disease, unspecified: Secondary | ICD-10-CM | POA: Insufficient documentation

## 2022-11-10 HISTORY — PX: CYSTOSCOPY/URETEROSCOPY/HOLMIUM LASER/STENT PLACEMENT: SHX6546

## 2022-11-10 LAB — GLUCOSE, CAPILLARY: Glucose-Capillary: 109 mg/dL — ABNORMAL HIGH (ref 70–99)

## 2022-11-10 SURGERY — CYSTOSCOPY/URETEROSCOPY/HOLMIUM LASER/STENT PLACEMENT
Anesthesia: General | Laterality: Left

## 2022-11-10 MED ORDER — AMISULPRIDE (ANTIEMETIC) 5 MG/2ML IV SOLN
INTRAVENOUS | Status: AC
  Filled 2022-11-10: qty 4

## 2022-11-10 MED ORDER — CEFAZOLIN SODIUM-DEXTROSE 2-4 GM/100ML-% IV SOLN
2.0000 g | INTRAVENOUS | Status: AC
  Administered 2022-11-10: 2 g via INTRAVENOUS
  Filled 2022-11-10: qty 100

## 2022-11-10 MED ORDER — KETOROLAC TROMETHAMINE 30 MG/ML IJ SOLN
INTRAMUSCULAR | Status: AC
  Filled 2022-11-10: qty 1

## 2022-11-10 MED ORDER — DEXAMETHASONE SODIUM PHOSPHATE 10 MG/ML IJ SOLN
INTRAMUSCULAR | Status: AC
  Filled 2022-11-10: qty 1

## 2022-11-10 MED ORDER — SODIUM CHLORIDE 0.9% FLUSH: 3.0000 mL | Freq: Two times a day (BID) | INTRAVENOUS | Status: DC

## 2022-11-10 MED ORDER — PROPOFOL 10 MG/ML IV BOLUS
INTRAVENOUS | Status: AC
  Filled 2022-11-10: qty 20

## 2022-11-10 MED ORDER — FENTANYL CITRATE PF 50 MCG/ML IJ SOSY: 25.0000 ug | PREFILLED_SYRINGE | INTRAMUSCULAR | Status: DC | PRN

## 2022-11-10 MED ORDER — ONDANSETRON HCL 4 MG/2ML IJ SOLN
INTRAMUSCULAR | Status: AC
  Filled 2022-11-10: qty 2

## 2022-11-10 MED ORDER — LIDOCAINE HCL (CARDIAC) PF 100 MG/5ML IV SOSY
PREFILLED_SYRINGE | INTRAVENOUS | Status: DC | PRN
  Administered 2022-11-10: 100 mg via INTRAVENOUS

## 2022-11-10 MED ORDER — PROPOFOL 10 MG/ML IV BOLUS
INTRAVENOUS | Status: DC | PRN
  Administered 2022-11-10: 200 mg via INTRAVENOUS

## 2022-11-10 MED ORDER — KETOROLAC TROMETHAMINE 30 MG/ML IJ SOLN
INTRAMUSCULAR | Status: DC | PRN
  Administered 2022-11-10: 30 mg via INTRAVENOUS

## 2022-11-10 MED ORDER — FENTANYL CITRATE (PF) 100 MCG/2ML IJ SOLN
INTRAMUSCULAR | Status: DC | PRN
  Administered 2022-11-10: 100 ug via INTRAVENOUS
  Administered 2022-11-10: 50 ug via INTRAVENOUS
  Administered 2022-11-10: 100 ug via INTRAVENOUS

## 2022-11-10 MED ORDER — INSULIN ASPART 100 UNIT/ML IJ SOLN: 0.0000 [IU] | INTRAMUSCULAR | Status: DC | PRN

## 2022-11-10 MED ORDER — OXYCODONE HCL 5 MG/5ML PO SOLN: 5.0000 mg | Freq: Once | ORAL | Status: AC | PRN

## 2022-11-10 MED ORDER — LACTATED RINGERS IV SOLN: INTRAVENOUS | Status: DC

## 2022-11-10 MED ORDER — FENTANYL CITRATE (PF) 250 MCG/5ML IJ SOLN
INTRAMUSCULAR | Status: AC
  Filled 2022-11-10: qty 5

## 2022-11-10 MED ORDER — OXYCODONE HCL 5 MG PO TABS
ORAL_TABLET | ORAL | Status: AC
  Filled 2022-11-10: qty 1

## 2022-11-10 MED ORDER — MIDAZOLAM HCL 5 MG/5ML IJ SOLN
INTRAMUSCULAR | Status: DC | PRN
  Administered 2022-11-10: 2 mg via INTRAVENOUS

## 2022-11-10 MED ORDER — OXYCODONE HCL 5 MG PO TABS
5.0000 mg | ORAL_TABLET | Freq: Once | ORAL | Status: AC | PRN
  Administered 2022-11-10: 5 mg via ORAL

## 2022-11-10 MED ORDER — LIDOCAINE HCL (PF) 2 % IJ SOLN
INTRAMUSCULAR | Status: AC
  Filled 2022-11-10: qty 5

## 2022-11-10 MED ORDER — IOHEXOL 300 MG/ML  SOLN
INTRAMUSCULAR | Status: DC | PRN
  Administered 2022-11-10: 8 mL

## 2022-11-10 MED ORDER — DEXAMETHASONE SODIUM PHOSPHATE 10 MG/ML IJ SOLN
INTRAMUSCULAR | Status: DC | PRN
  Administered 2022-11-10: 8 mg via INTRAVENOUS

## 2022-11-10 MED ORDER — ACETAMINOPHEN 10 MG/ML IV SOLN: 1000.0000 mg | Freq: Once | INTRAVENOUS | Status: DC | PRN

## 2022-11-10 MED ORDER — ONDANSETRON HCL 4 MG/2ML IJ SOLN
INTRAMUSCULAR | Status: DC | PRN
  Administered 2022-11-10: 4 mg via INTRAVENOUS

## 2022-11-10 MED ORDER — SODIUM CHLORIDE 0.9 % IR SOLN
Status: DC | PRN
  Administered 2022-11-10 (×2): 3000 mL

## 2022-11-10 MED ORDER — OXYCODONE-ACETAMINOPHEN 5-325 MG PO TABS: 1.0000 | ORAL_TABLET | Freq: Four times a day (QID) | ORAL | 0 refills | Status: AC | PRN

## 2022-11-10 MED ORDER — ACETAMINOPHEN 500 MG PO TABS
1000.0000 mg | ORAL_TABLET | Freq: Once | ORAL | Status: AC
  Administered 2022-11-10: 1000 mg via ORAL
  Filled 2022-11-10: qty 2

## 2022-11-10 MED ORDER — CHLORHEXIDINE GLUCONATE 0.12 % MT SOLN
15.0000 mL | Freq: Once | OROMUCOSAL | Status: AC
  Administered 2022-11-10: 15 mL via OROMUCOSAL

## 2022-11-10 MED ORDER — ORAL CARE MOUTH RINSE: 15.0000 mL | Freq: Once | OROMUCOSAL | Status: AC

## 2022-11-10 MED ORDER — MIDAZOLAM HCL 2 MG/2ML IJ SOLN
INTRAMUSCULAR | Status: AC
  Filled 2022-11-10: qty 2

## 2022-11-10 MED ORDER — AMISULPRIDE (ANTIEMETIC) 5 MG/2ML IV SOLN
10.0000 mg | Freq: Once | INTRAVENOUS | Status: AC | PRN
  Administered 2022-11-10: 10 mg via INTRAVENOUS

## 2022-11-10 SURGICAL SUPPLY — 24 items
BAG URO CATCHER STRL LF (MISCELLANEOUS) ×2 IMPLANT
BASKET STONE NCOMPASS (UROLOGICAL SUPPLIES) IMPLANT
CATH URETERAL DUAL LUMEN 10F (MISCELLANEOUS) IMPLANT
CATH URETL OPEN 5X70 (CATHETERS) IMPLANT
CLOTH BEACON ORANGE TIMEOUT ST (SAFETY) ×2 IMPLANT
EXTRACTOR STONE NITINOL NGAGE (UROLOGICAL SUPPLIES) IMPLANT
GLOVE SURG SS PI 8.0 STRL IVOR (GLOVE) ×2 IMPLANT
GOWN STRL REUS W/ TWL XL LVL3 (GOWN DISPOSABLE) ×2 IMPLANT
GOWN STRL REUS W/TWL XL LVL3 (GOWN DISPOSABLE) ×1
GUIDEWIRE STR DUAL SENSOR (WIRE) ×2 IMPLANT
IV NS IRRIG 3000ML ARTHROMATIC (IV SOLUTION) ×2 IMPLANT
KIT TURNOVER KIT A (KITS) IMPLANT
LASER FIB FLEXIVA PULSE ID 365 (Laser) IMPLANT
LASER FIB FLEXIVA PULSE ID 550 (Laser) IMPLANT
LASER FIB FLEXIVA PULSE ID 910 (Laser) IMPLANT
MANIFOLD NEPTUNE II (INSTRUMENTS) ×2 IMPLANT
PACK CYSTO (CUSTOM PROCEDURE TRAY) ×2 IMPLANT
SHEATH NAV HD 11/13X46 (SHEATH) IMPLANT
SHEATH NAVIGATOR HD 11/13X36 (SHEATH) IMPLANT
STENT URET 6FRX26 CONTOUR (STENTS) IMPLANT
TRACTIP FLEXIVA PULS ID 200XHI (Laser) IMPLANT
TRACTIP FLEXIVA PULSE ID 200 (Laser) ×1
TUBING CONNECTING 10 (TUBING) ×2 IMPLANT
TUBING UROLOGY SET (TUBING) ×2 IMPLANT

## 2022-11-10 NOTE — Op Note (Signed)
Procedure: 1.  Cystoscopy with left retrograde pyelogram and interpretation. 2.  Left ureteroscopy with holmium laser application, stone extraction and insertion of left double-J stent. 3.  Application of fluoroscopy.  Preop diagnosis: 13 mm left renal pelvic stone.  Postop diagnosis: Same.  Surgeon: Dr. Bjorn Pippin.  Anesthesia: General.  Specimen: Stone fragments.  Drain: 6 [place] by 26 cm left contour double-J stent without tether.  EBL: None.  Complications: None.  Indications: The patient is a 57 year old male who was found over 13 mm renal pelvic stone on the left.  He is elected to undergo ureteroscopy for treatment.  Procedure: He was taken the operating room where general anesthetic was induced.  He was he was placed in lithotomy position and fitted with PAS hose.  His perineum and genitalia were prepped Betadine solution he was draped in usual sterile fashion.  Cystoscopy was performed using a 21 [place] scope and 30 degree lens.  Examination revealed a normal urethra.  The external sphincter was intact.  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  Examination of bladder revealed mild trabeculation without tumors, stones or inflammation.  Ureteral orifices were unremarkable.  The left ureteral orifice was cannulated with a 5 [place] open-ended catheter.  Omnipaque was instilled to perform retrograde pyelogram.  The left retrograde pyelogram demonstrated a normal ureter to the kidney with a filling defect in the kidney consistent with a stone without significant hydronephrosis.  A sensor wire was advanced the kidney through the open-ended catheter and the cystoscope and open-ended catheter were removed.  a 46 cm 11/13 [place] digital access sheath was used.  First the inner core was passed over the wire without difficulty to the kidney.  This was followed by the assembled sheath once again without difficulty.  The inner core  and wire were removed.  A dual-lumen digital flexible scope was then passed the kidney and the stone was identified in the renal pelvis but it pushed back in a midpole calyx.  The stone was then fragmented using the 242 m holmium laser fiber set on the dusting setting with 0.3 J and 60 Hz on the left pedal and 1 J and 8 Hz on the right pedal.  The stone broke readily into small fragments.  Once the stone had been completely fragmented, a engage basket was then used to remove a few of the fragments but most were too small to retrieve with the basket.  And it was felt that they would be passable.  Ureteroscope was removed and a sensor wire was replaced to the kidney.  The access sheath was removed and the cystoscope was reinserted over the wire.  A 6 French by 26 cm contour double-J stent without tether was then advanced the kidney under fluoroscopic guidance.  The wire was removed, leaving good coil in the kidney and a good coil in the bladder.  The bladder was drained and the cystoscope was removed.  He was taken down from lithotomy position, his anesthetic was reversed and he was moved recovery in stable condition.  There were no complications.  Stone fragments were sent for analysis.

## 2022-11-10 NOTE — Anesthesia Procedure Notes (Signed)
Procedure Name: LMA Insertion Date/Time: 11/10/2022 12:18 PM  Performed by: Shanon Payor, CRNAPre-anesthesia Checklist: Patient identified, Emergency Drugs available, Suction available, Patient being monitored and Timeout performed Patient Re-evaluated:Patient Re-evaluated prior to induction Oxygen Delivery Method: Circle system utilized Preoxygenation: Pre-oxygenation with 100% oxygen Induction Type: IV induction LMA: LMA inserted LMA Size: 4.0 Number of attempts: 1 Placement Confirmation: positive ETCO2, CO2 detector and breath sounds checked- equal and bilateral Tube secured with: Tape Dental Injury: Teeth and Oropharynx as per pre-operative assessment

## 2022-11-10 NOTE — Interval H&P Note (Signed)
History and Physical Interval Note:  11/10/2022 11:53 AM  Kirk Small  has presented today for surgery, with the diagnosis of LEFT RENAL STONE.  The various methods of treatment have been discussed with the patient and family. After consideration of risks, benefits and other options for treatment, the patient has consented to  Procedure(s) with comments: CYSTOSCOPY LEFT URETEROSCOPY/HOLMIUM LASER/STENT PLACEMENT (Left) - 1 HR FOR CASE as a surgical intervention.  The patient's history has been reviewed, patient examined, no change in status, stable for surgery.  I have reviewed the patient's chart and labs.  Questions were answered to the patient's satisfaction.     Bjorn Pippin

## 2022-11-10 NOTE — Transfer of Care (Signed)
Immediate Anesthesia Transfer of Care Note  Patient: Kirk Small  Procedure(s) Performed: CYSTOSCOPY LEFT URETEROSCOPY/HOLMIUM LASER/STENT PLACEMENT (Left)  Patient Location: PACU  Anesthesia Type:General  Level of Consciousness: awake, alert , and patient cooperative  Airway & Oxygen Therapy: Patient Spontanous Breathing and Patient connected to nasal cannula oxygen  Post-op Assessment: Report given to RN, Post -op Vital signs reviewed and stable, and Patient moving all extremities X 4  Post vital signs: Reviewed and stable  Last Vitals:  Vitals Value Taken Time  BP 145/102 11/10/22 1309  Temp    Pulse 90 11/10/22 1309  Resp 13 11/10/22 1309  SpO2 100 % 11/10/22 1309  Vitals shown include unfiled device data.  Last Pain:  Vitals:   11/10/22 1013  TempSrc: Oral  PainSc: 4       Patients Stated Pain Goal: 3 (11/10/22 1013)  Complications: No notable events documented.

## 2022-11-10 NOTE — Discharge Instructions (Addendum)
We will remove the stent by looking in your bladder when you return to the office.    I sent the few stones I removed off for chemical analysis, but you should pass others, particularly once the stent is out.

## 2022-11-10 NOTE — Anesthesia Postprocedure Evaluation (Signed)
Anesthesia Post Note  Patient: Kirk Small  Procedure(s) Performed: CYSTOSCOPY LEFT URETEROSCOPY/HOLMIUM LASER/STENT PLACEMENT (Left)     Patient location during evaluation: PACU Anesthesia Type: General Level of consciousness: sedated Pain management: pain level controlled Vital Signs Assessment: post-procedure vital signs reviewed and stable Respiratory status: spontaneous breathing and respiratory function stable Cardiovascular status: stable Postop Assessment: no apparent nausea or vomiting Anesthetic complications: no   No notable events documented.  Last Vitals:  Vitals:   11/10/22 1353 11/10/22 1405  BP:  (!) 165/105  Pulse:    Resp:    Temp: 36.6 C   SpO2:      Last Pain:  Vitals:   11/10/22 1405  TempSrc:   PainSc: 2                  Tyneisha Hegeman DANIEL

## 2022-11-11 ENCOUNTER — Encounter (HOSPITAL_COMMUNITY): Payer: Self-pay | Admitting: Urology

## 2022-11-19 LAB — CALCULI, WITH PHOTOGRAPH (CLINICAL LAB)
Calcium Oxalate Monohydrate: 95 %
Hydroxyapatite: 5 %
Weight Calculi: 22 mg

## 2022-11-23 ENCOUNTER — Ambulatory Visit (HOSPITAL_BASED_OUTPATIENT_CLINIC_OR_DEPARTMENT_OTHER): Payer: Commercial Managed Care - HMO | Admitting: Family Medicine

## 2022-11-23 NOTE — Progress Notes (Deleted)
Subjective:   Kirk Small 07/29/1965 11/23/2022  CC: No chief complaint on file.   HPI: Kirk Small is a 57 y.o. male who presents for a routine health maintenance exam.  Labs collected at time of visit.   HEALTH SCREENINGS: - Vision Screening: {Blank single:19197::"pap done","not applicable","up to date","done elsewhere"} - Dental Visits: {Blank single:19197::"pap done","not applicable","up to date","done elsewhere"} - Testicular Exam: {Blank single:19197::"Up to date","Ordered today","Not applicable","Declined","Done elsewhere"} - STD Screening: {Blank single:19197::"Up to date","Ordered today","Not applicable","Declined","Done elsewhere"} - PSA (50+): {Blank single:19197::"Up to date","Ordered today","Not applicable","Declined","Done elsewhere"}   Lab Results  Component Value Date   PSA1 1.2 10/26/2022     - Colonoscopy (45+): {Blank single:19197::"Up to date","Ordered today","Not applicable","Declined","Done elsewhere"}  Discussed with patient purpose of the colonoscopy is to detect colon cancer at curable precancerous or early stages  - AAA Screening: {Blank single:19197::"Up to date","Ordered today","Not applicable","Declined","Done elsewhere"}  Men age 4-75 who have ever smoked - Lung Cancer screening with low-dose CT: {Blank single:19197::"Up to date","Ordered today","Not applicable","Declined","Done elsewhere"}-  Adults age 86-80 who are current cigarette smokers or quit within the last 15 years. Must have 20 pack year history.   Depression and Anxiety Screen done today and results listed below:     10/26/2022    1:16 PM  Depression screen PHQ 2/9  Decreased Interest 0  Down, Depressed, Hopeless 0  PHQ - 2 Score 0  Altered sleeping 3  Tired, decreased energy 1  Change in appetite 0  Feeling bad or failure about yourself  0  Trouble concentrating 0  Moving slowly or fidgety/restless 0  Suicidal thoughts 0  PHQ-9 Score 4  Difficult doing  work/chores Not difficult at all      10/26/2022    1:16 PM  GAD 7 : Generalized Anxiety Score  Nervous, Anxious, on Edge 0  Control/stop worrying 0  Worry too much - different things 0  Trouble relaxing 0  Restless 0  Easily annoyed or irritable 1  Afraid - awful might happen 0  Total GAD 7 Score 1  Anxiety Difficulty Not difficult at all    IMMUNIZATIONS:  - Tdap: Tetanus vaccination status reviewed: {tetanus status:315746}. - Influenza: {Blank single:19197::"Up to date","Administered today","Postponed to flu season","Refused","Given elsewhere"} - Pneumovax: {Blank single:19197::"Up to date","Administered today","Not applicable","Refused","Given elsewhere"} - Prevnar: {Blank single:19197::"Up to date","Administered today","Not applicable","Refused","Given elsewhere"} - Zostavax vaccine (50+): {Blank single:19197::"Up to date","Administered today","Not applicable","Refused","Given elsewhere"}   Past medical history, surgical history, medications, allergies, family history and social history reviewed with patient today and changes made to appropriate areas of the chart.   Past Medical History:  Diagnosis Date   Headache    migraines   History of kidney stones    Hypercholesteremia    Pre-diabetes     Past Surgical History:  Procedure Laterality Date   cat scratch fever     CYSTOSCOPY/URETEROSCOPY/HOLMIUM LASER/STENT PLACEMENT Left 11/10/2022   Procedure: CYSTOSCOPY LEFT URETEROSCOPY/HOLMIUM LASER/STENT PLACEMENT;  Surgeon: Bjorn Pippin, MD;  Location: [facility];  Service: Urology;  Laterality: Left;  1 HR FOR CASE    Current Outpatient Medications on File Prior to Visit  Medication Sig   atorvastatin (LIPITOR) 10 MG tablet Take 1 tablet (10 mg total) by mouth at bedtime.   cyclobenzaprine (FLEXERIL) 10 MG tablet Take 1 tablet (10 mg total) by mouth 3 (three) times daily as needed for muscle spasms. (Patient not taking: Reported on 11/04/2022)   ibuprofen (ADVIL) 200 MG tablet  Take 400 mg by mouth every 6 (six) hours as needed  for moderate pain.   ibuprofen (ADVIL,MOTRIN) 800 MG tablet Take 1 tablet (800 mg total) by mouth 3 (three) times daily. (Patient not taking: Reported on 11/04/2022)   oxyCODONE-acetaminophen (PERCOCET/ROXICET) 5-325 MG tablet Take 1 tablet by mouth every 6 (six) hours as needed for moderate pain.   tamsulosin (FLOMAX) 0.4 MG CAPS capsule Take 1 capsule (0.4 mg total) by mouth daily.   No current facility-administered medications on file prior to visit.    No Known Allergies   Social History   Socioeconomic History   Marital status: Single    Spouse name: Not on file   Number of children: Not on file   Years of education: Not on file   Highest education level: Not on file  Occupational History   Not on file  Tobacco Use   Smoking status: Every Day    Current packs/day: 1.00    Average packs/day: 1 pack/day for 5.6 years (5.6 ttl pk-yrs)    Types: Cigarettes    Start date: 2019    Last attempt to quit: 1985   Smokeless tobacco: Not on file  Vaping Use   Vaping status: Never Used  Substance and Sexual Activity   Alcohol use: Yes    Comment: occ   Drug use: Yes    Frequency: 1.0 times per week    Types: Marijuana   Sexual activity: Not on file  Other Topics Concern   Not on file  Social History Narrative   Not on file   Social Determinants of Health   Financial Resource Strain: Not on file  Food Insecurity: Not on file  Transportation Needs: Not on file  Physical Activity: Not on file  Stress: Not on file  Social Connections: Unknown (09/06/2021)   [facility]   Social Network    Social Network: Not on file  Intimate Partner Violence: Unknown (07/30/2021)   Received from [facility]   HITS    Physically Hurt: Not on file    Insult or Talk Down To: Not on file    Threaten Physical Harm: Not on file    Scream or Curse: Not on file   Social History   Tobacco Use  Smoking Status Every Day    Current packs/day: 1.00   Average packs/day: 1 pack/day for 5.6 years (5.6 ttl pk-yrs)   Types: Cigarettes   Start date: 2019   Last attempt to quit: 1985  Smokeless Tobacco Not on file   Social History   Substance and Sexual Activity  Alcohol Use Yes   Comment: occ     Family History  Problem Relation Age of Onset   Cancer Paternal Uncle      ROS: Denies fever, fatigue, unexplained weight loss/gain, CP, SHOB, and palpatitations. Denies neurological deficits, gastrointestinal and/or genitourinary complaints, and skin changes.   Objective:   There were no vitals filed for this visit.  GENERAL APPEARANCE: Well-appearing, in NAD. Well nourished.  SKIN: Pink, warm and dry. Turgor normal. No rash, lesion, ulceration, or ecchymoses. Hair evenly distributed.  HEENT: HEAD: Normocephalic.  EYES: PERRLA. EOMI. Lids intact w/o defect. Sclera white, Conjunctiva pink w/o exudate.  EARS: External ear w/o redness, swelling, masses or lesions. EAC clear. TM's intact, translucent w/o bulging, appropriate landmarks visualized. Appropriate acuity to conversational tones.  NOSE: Septum midline w/o deformity. Nares patent, mucosa pink and non-inflamed w/o drainage. No sinus tenderness.  THROAT: Uvula midline. Oropharynx clear. Tonsils non-inflamed w/o exudate. Oral mucosa pink and moist.  NECK: Supple, Trachea  midline. Full ROM w/o pain or tenderness. No lymphadenopathy. Thyroid non-tender w/o enlargement or palpable masses.  RESPIRATORY: Chest wall symmetrical w/o masses. Respirations even and non-labored. Breath sounds clear to auscultation bilaterally. No wheezes, rales, rhonchi, or crackles. CARDIAC: S1, S2 present, regular rate and rhythm. No gallops, murmurs, rubs, or clicks. PMI w/o lifts, heaves, or thrills. No carotid bruits. Capillary refill <2 seconds. Peripheral pulses 2+ bilaterally. GI: Abdomen soft w/o distention. Normoactive bowel sounds. No palpable masses or tenderness. No guarding  or rebound tenderness. Liver and spleen w/o tenderness or enlargement. No CVA tenderness.  GU: *** deferred exam. MSK: Muscle tone and strength appropriate for age, w/o atrophy or abnormal movement. EXTREMITIES: Active ROM intact, w/o tenderness, crepitus, or contracture. No obvious joint deformities or effusions. No clubbing, edema, or cyanosis.  NEUROLOGIC: CN's II-XII intact. Motor strength symmetrical with no obvious weakness. No sensory deficits. DTR 2+ symmetric bilaterally. Steady, even gait.  PSYCH/MENTAL STATUS: Alert, oriented x 3. Cooperative, appropriate mood and affect.   Results for orders placed or performed during the hospital encounter of 11/10/22  Glucose, capillary  Result Value Ref Range   Glucose-Capillary 109 (H) 70 - 99 mg/dL  Calculi, with Photograph (to Clinical Lab)  Result Value Ref Range   Source Calculi Comment    Color Calculi Tan    Size Calculi 3x3 mm   Weight Calculi 22 mg   Composition Calculi Comment    Calcium Oxalate Monohydrate 95 %   Hydroxyapatite 5 %   Comment Calculi 1 Comment    Comment Calculi 2 Comment    Photo Calculi Comment    Comment Calculi 3 Comment    Please Note: Comment    DISCLAIMER: Comment     Assessment & Plan:  ***   No orders of the defined types were placed in this encounter.   PATIENT COUNSELING: - Encouraged to adjust caloric intake to maintain or achieve ideal body weight, to reduce intake of dietary saturated fat and total fat, to limit sodium intake by avoiding high sodium foods and not adding table salt, and to maintain adequate dietary potassium and calcium preferably from fresh fruits, vegetables, and low-fat dairy products.   - Advised to avoid cigarette smoking. - Discussed with the patient that most people either abstain from alcohol or drink within safe limits (<=14/week and <=4 drinks/occasion for males, <=7/weeks and <= 3 drinks/occasion for females) and that the risk for alcohol disorders and other  health effects rises proportionally with the number of drinks per week and how often a drinker exceeds daily limits. - Discussed cessation/primary prevention of drug use and availability of treatment for abuse.   - Stressed the importance of regular exercise - Injury prevention: Discussed safety belts, safety helmets, smoke detector, smoking near bedding or upholstery.  - Dental health: Discussed importance of regular tooth brushing, flossing, and dental visits.  - Sexuality: Discussed sexually transmitted diseases, partner selection, use of condoms, avoidance of unintended pregnancy  and contraceptive alternatives.   NEXT PREVENTATIVE PHYSICAL DUE IN 1 YEAR.  No follow-ups on file.  Patient to reach out to office if new, worrisome, or unresolved symptoms arise or if no improvement in patient's condition. Patient verbalized understanding and is agreeable to treatment plan. All questions answered to patient's satisfaction.    Yolanda Manges, FNP

## 2023-01-29 ENCOUNTER — Other Ambulatory Visit: Payer: Self-pay | Admitting: Family Medicine

## 2023-01-29 DIAGNOSIS — Z1212 Encounter for screening for malignant neoplasm of rectum: Secondary | ICD-10-CM

## 2023-01-29 DIAGNOSIS — Z1211 Encounter for screening for malignant neoplasm of colon: Secondary | ICD-10-CM
